# Patient Record
Sex: Female | Born: 1960 | Race: Black or African American | Hispanic: No | Marital: Married | Smoking: Never smoker
Health system: Southern US, Community
[De-identification: ages and names within clinical notes are randomized; demographics above are authoritative.]

## PROBLEM LIST (undated history)

## (undated) DIAGNOSIS — I10 Essential (primary) hypertension: Secondary | ICD-10-CM

## (undated) DIAGNOSIS — E079 Disorder of thyroid, unspecified: Secondary | ICD-10-CM

## (undated) DIAGNOSIS — T7840XA Allergy, unspecified, initial encounter: Secondary | ICD-10-CM

## (undated) HISTORY — PX: PARTIAL HYSTERECTOMY: SHX80

## (undated) HISTORY — DX: Allergy, unspecified, initial encounter: T78.40XA

---

## 1998-01-18 ENCOUNTER — Encounter (HOSPITAL_COMMUNITY): Admission: RE | Admit: 1998-01-18 | Discharge: 1998-04-18 | Payer: Self-pay | Admitting: Internal Medicine

## 1998-02-26 ENCOUNTER — Encounter: Admission: RE | Admit: 1998-02-26 | Discharge: 1998-05-27 | Payer: Self-pay | Admitting: Internal Medicine

## 1998-07-12 ENCOUNTER — Other Ambulatory Visit: Admission: RE | Admit: 1998-07-12 | Discharge: 1998-07-12 | Payer: Self-pay | Admitting: Obstetrics and Gynecology

## 1999-05-28 ENCOUNTER — Encounter: Admission: RE | Admit: 1999-05-28 | Discharge: 1999-08-26 | Payer: Self-pay | Admitting: Internal Medicine

## 1999-07-31 ENCOUNTER — Encounter: Admission: RE | Admit: 1999-07-31 | Discharge: 1999-07-31 | Payer: Self-pay | Admitting: Internal Medicine

## 1999-07-31 ENCOUNTER — Encounter: Payer: Self-pay | Admitting: Internal Medicine

## 1999-08-12 ENCOUNTER — Emergency Department (HOSPITAL_COMMUNITY): Admission: EM | Admit: 1999-08-12 | Discharge: 1999-08-12 | Payer: Self-pay | Admitting: Internal Medicine

## 1999-08-21 ENCOUNTER — Other Ambulatory Visit: Admission: RE | Admit: 1999-08-21 | Discharge: 1999-08-21 | Payer: Self-pay | Admitting: Obstetrics and Gynecology

## 1999-12-27 ENCOUNTER — Encounter: Admission: RE | Admit: 1999-12-27 | Discharge: 2000-03-26 | Payer: Self-pay | Admitting: Internal Medicine

## 2001-08-27 ENCOUNTER — Other Ambulatory Visit: Admission: RE | Admit: 2001-08-27 | Discharge: 2001-08-27 | Payer: Self-pay | Admitting: Obstetrics and Gynecology

## 2002-11-28 ENCOUNTER — Other Ambulatory Visit: Admission: RE | Admit: 2002-11-28 | Discharge: 2002-11-28 | Payer: Self-pay | Admitting: Obstetrics and Gynecology

## 2002-12-16 ENCOUNTER — Encounter: Payer: Self-pay | Admitting: Obstetrics and Gynecology

## 2002-12-16 ENCOUNTER — Encounter: Admission: RE | Admit: 2002-12-16 | Discharge: 2002-12-16 | Payer: Self-pay | Admitting: Obstetrics and Gynecology

## 2004-04-17 ENCOUNTER — Other Ambulatory Visit: Admission: RE | Admit: 2004-04-17 | Discharge: 2004-04-17 | Payer: Self-pay | Admitting: Obstetrics and Gynecology

## 2005-05-09 ENCOUNTER — Other Ambulatory Visit: Admission: RE | Admit: 2005-05-09 | Discharge: 2005-05-09 | Payer: Self-pay | Admitting: Obstetrics and Gynecology

## 2005-10-02 ENCOUNTER — Encounter: Admission: RE | Admit: 2005-10-02 | Discharge: 2005-10-02 | Payer: Self-pay | Admitting: Internal Medicine

## 2008-06-07 ENCOUNTER — Encounter: Admission: RE | Admit: 2008-06-07 | Discharge: 2008-06-07 | Payer: Self-pay | Admitting: Obstetrics and Gynecology

## 2008-06-11 ENCOUNTER — Encounter: Admission: RE | Admit: 2008-06-11 | Discharge: 2008-06-11 | Payer: Self-pay | Admitting: Interventional Radiology

## 2008-07-19 ENCOUNTER — Encounter: Admission: RE | Admit: 2008-07-19 | Discharge: 2008-07-19 | Payer: Self-pay | Admitting: Internal Medicine

## 2008-09-04 ENCOUNTER — Encounter (INDEPENDENT_AMBULATORY_CARE_PROVIDER_SITE_OTHER): Payer: Self-pay | Admitting: Obstetrics and Gynecology

## 2008-09-04 ENCOUNTER — Ambulatory Visit (HOSPITAL_COMMUNITY): Admission: RE | Admit: 2008-09-04 | Discharge: 2008-09-05 | Payer: Self-pay | Admitting: Obstetrics and Gynecology

## 2008-10-20 ENCOUNTER — Encounter: Admission: RE | Admit: 2008-10-20 | Discharge: 2008-10-20 | Payer: Self-pay | Admitting: Obstetrics and Gynecology

## 2008-11-14 ENCOUNTER — Encounter: Admission: RE | Admit: 2008-11-14 | Discharge: 2008-11-14 | Payer: Self-pay | Admitting: Internal Medicine

## 2010-11-03 ENCOUNTER — Encounter: Payer: Self-pay | Admitting: Obstetrics and Gynecology

## 2011-02-25 NOTE — Op Note (Signed)
NAME:  Ashley Sampson, Ashley Sampson NO.:  [id number]   MEDICAL RECORD NO.:  [id number]          PATIENT TYPE:  OIB   LOCATION:  9303                          FACILITY:  [facility]   PHYSICIAN:  Duke Salvia. Marcelle Overlie, M.D.DATE OF BIRTH:  04/25/1961   DATE OF PROCEDURE:  09/04/2008  DATE OF DISCHARGE:                               OPERATIVE REPORT   PREOPERATIVE DIAGNOSIS:  Symptomatic leiomyoma.   POSTOPERATIVE DIAGNOSIS:  Symptomatic leiomyoma.   PROCEDURE:  LAVH.   SURGEON:  Duke Salvia. Marcelle Overlie, MD   ASSISTANT:  Zelphia Cairo, MD   ANESTHESIA:  General endotracheal.   COMPLICATIONS:  None.   DRAINS:  Foley catheter.   BLOOD LOSS:  300 mL.   SPECIMENS REMOVED:  Uterus and cervix.   PROCEDURE AND FINDINGS:  The patient was taken to the operating room.  After an adequate level of general endotracheal anesthesia was obtained  and the patient's legs in stirrups, the abdomen, perineum, and vagina  were prepped and draped in the usual manner for laparoscopy.  The  bladder was drained.  EUA carried out.  Uterus was mobile, 10-week size,  adnexa unremarkable.  Hulka tenaculum was positioned.  Attention  directed to the subumbilical area, which was infiltrated with 0.25%  Marcaine plain.  A small incision was made.  The Veress needle was  introduced without difficulty; its intra-abdominal position verified by  pressure and water testing.  After 2.5 L pneumoperitoneum was then  created, laparoscopic trocar and sleeve were then introduced without  difficulty.  Three fingerbreadths above the symphysis in the midline, a  5-mm trocar was inserted under direct visualization.   Pelvic findings as follows:  Left tube and ovary were unremarkable.  Right tube had some filmy adhesions at the distal pole of the ovary,  which were freed up in an avascular plane.  No other abnormalities noted  except for the uterus being irregular 10-week size, cul-de-sac free and  clear.  Using the instill  instrument starting on the right utero-ovarian  pedicle, this was coagulated and divided down to including the round  ligament on that side with excellent hemostasis.  The exact same  repeated on the opposite side conserving both ovaries.  At this point,  the vaginal portion of the procedure was started.   The patient's legs were extended.  Weighted speculum was positioned.  Cervix grasped with tenaculum.  Cervicovaginal mucosa incised, and  posterior culdotomy performed without difficulty.  The bladder was  advanced superiorly with sharp and blunt dissection until the anterior  peritoneal reflection could be identified.  This was then entered  sharply and retractor used to gently elevate the bladder out of the  field in a sequential manner.  The LigaSure instrument was used to  coagulate and divide the uterosacral ligament, cardinal ligament,  uterine vasculature pedicle, and upper broad ligament pedicles.  Morcellation was required in the central part of the specimen to get the  fundus of the uterus to deliver posteriorly.  Once morcellation had  occurred, the remainder of the fundus was delivered posteriorly and  remaining pedicles were clamped, divided, first free  tied followed by  suture ligature of 0-Vicryl.  The cuff was then closed with 3-9 o'clock  in a running-locked fashion with 2-0 Vicryl suture.  This was  hemostatic.  The vagina was then closed right to left with interrupted 2-  0 Monocryl sutures.  Foley catheter positioned, draining clear urine.  At this point, laparoscopy was carried out with a Nezhat irrigator,  copious irrigation, and aspiration.  Reduced pressure revealed excellent  hemostasis.  Instruments were removed, gas allowed to escape.  Defects  closed with 4-0 Dexon subcuticular sutures and Dermabond.  She tolerated  this well, went to recovery room in good condition.      Richard M. Marcelle Overlie, M.D.  Electronically Signed     RMH/MEDQ  D:  09/04/2008   T:  09/04/2008  Job:  [id number]

## 2011-02-28 NOTE — H&P (Signed)
NAME:  Ashley Sampson, LYNE NO.:  [id number]   MEDICAL RECORD NO.:  [id number]         PATIENT TYPE:  WAMB   LOCATION:                                [facility]ILITY:  [facility]   PHYSICIAN:  Duke Salvia. Marcelle Overlie, M.D.DATE OF BIRTH:  October 29, 1960   DATE OF ADMISSION:  09/04/2008  DATE OF DISCHARGE:                              HISTORY & PHYSICAL   CHIEF COMPLAINT:  Symptomatic leiomyoma, menorrhagia, dysmenorrhea, and  pelvic pain.   HISTORY OF PRESENT ILLNESS:  A 50 year old, G4, P1.  Her husband has had  a vasectomy.  She has a several-year history of worsening problems  related to leiomyoma including menorrhagia and dysmenorrhea.  She was  really trying to avoid the need for hysterectomy, so she underwent in  2007 in our office [facility], but unfortunately continues to have  problems related to pain and bleeding.   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  Due to continued problems.  She presents now for definitive  hysterectomy.  This procedure including risks related to bleeding,  infection, transfusion, adjacent organ injury, and the possible need for  open additional surgery all reviewed with her along with her expected  recovery time.   CURRENT MEDICATIONS:  Motrin and Vicoprofen along with insulin pump and  trapezoid.   Review of systems is significant for diabetes.   Family history is significant for thyroid disease.  She has also had  thyroid disease treated in the past along with her diabetes.  There is a  family history of diabetes also.  Diabetologist is Dr. Felipa Eth.   She has had 1 vaginal delivery and endometrial ablation.  No other  abdominal surgery.   PHYSICAL EXAMINATION:  VITAL SIGNS:  Temp 98.2, blood pressure 120/78.  HEENT:  Unremarkable.  NECK:  Supple without  masses.  LUNGS:  Clear.  CARDIOVASCULAR:  Regular rate and rhythm without murmurs, rubs, or  gallops.  BREASTS:  Without masses.  ABDOMEN:  Soft, flat, nontender.  PELVIC:  Vulva, vagina, cervix normal.  Uterus 10-week size, irregular.  Adnexa negative.  NEUROLOGIC:  Unremarkable.   IMPRESSION:  Symptomatic leiomyoma, failed endometrial ablation with  continued pelvic pain, dysmenorrhea, and abnormal uterine bleeding.   PLAN:  LAVH procedure and risks reviewed as above.      Richard M. Marcelle Overlie, M.D.  Electronically Signed     RMH/MEDQ  D:  08/08/2008  T:  08/09/2008  Job:  [id number]

## 2011-07-15 LAB — COMPREHENSIVE METABOLIC PANEL
ALT: 12
AST: 12
Alkaline Phosphatase: 61
CO2: 30
Calcium: 8.7
Chloride: 102
GFR calc Af Amer: >60
GFR calc non Af Amer: >60
Glucose, Bld: 201 — ABNORMAL HIGH
Potassium: 3.6
Sodium: 138
Total Bilirubin: 0.5

## 2011-07-15 LAB — CBC
Hemoglobin: 11.4 — ABNORMAL LOW
MCV: 84
RBC: 3.27 — ABNORMAL LOW
RBC: 4.02
WBC: 4.6
WBC: 5.9

## 2011-07-15 LAB — TYPE AND SCREEN: Antibody Screen: NEGATIVE

## 2011-10-14 HISTORY — PX: ANKLE SURGERY: SHX546

## 2012-01-07 ENCOUNTER — Other Ambulatory Visit: Payer: Self-pay

## 2012-06-21 ENCOUNTER — Encounter (HOSPITAL_COMMUNITY): Payer: Self-pay

## 2012-06-21 ENCOUNTER — Emergency Department (HOSPITAL_COMMUNITY): Payer: Worker's Compensation

## 2012-06-21 ENCOUNTER — Emergency Department (HOSPITAL_COMMUNITY)
Admission: EM | Admit: 2012-06-21 | Discharge: 2012-06-21 | Disposition: A | Discharge status: Home / Self Care | Payer: Worker's Compensation | Attending: Emergency Medicine | Admitting: Emergency Medicine

## 2012-06-21 DIAGNOSIS — E079 Disorder of thyroid, unspecified: Secondary | ICD-10-CM | POA: Insufficient documentation

## 2012-06-21 DIAGNOSIS — Y9289 Other specified places as the place of occurrence of the external cause: Secondary | ICD-10-CM | POA: Insufficient documentation

## 2012-06-21 DIAGNOSIS — S93409A Sprain of unspecified ligament of unspecified ankle, initial encounter: Secondary | ICD-10-CM | POA: Insufficient documentation

## 2012-06-21 DIAGNOSIS — I10 Essential (primary) hypertension: Secondary | ICD-10-CM | POA: Insufficient documentation

## 2012-06-21 DIAGNOSIS — Y990 Civilian activity done for income or pay: Secondary | ICD-10-CM | POA: Insufficient documentation

## 2012-06-21 DIAGNOSIS — E119 Type 2 diabetes mellitus without complications: Secondary | ICD-10-CM | POA: Insufficient documentation

## 2012-06-21 DIAGNOSIS — W19XXXA Unspecified fall, initial encounter: Secondary | ICD-10-CM | POA: Insufficient documentation

## 2012-06-21 DIAGNOSIS — S93402A Sprain of unspecified ligament of left ankle, initial encounter: Secondary | ICD-10-CM

## 2012-06-21 DIAGNOSIS — Z9641 Presence of insulin pump (external) (internal): Secondary | ICD-10-CM | POA: Insufficient documentation

## 2012-06-21 HISTORY — DX: Essential (primary) hypertension: I10

## 2012-06-21 HISTORY — DX: Disorder of thyroid, unspecified: E07.9

## 2012-06-21 MED ORDER — HYDROCODONE-ACETAMINOPHEN 5-325 MG PO TABS: 2.0000 | ORAL_TABLET | ORAL | Status: AC | PRN

## 2012-06-21 MED ORDER — IBUPROFEN 600 MG PO TABS: 600.0000 mg | ORAL_TABLET | Freq: Four times a day (QID) | ORAL | Status: AC | PRN

## 2012-06-21 MED ORDER — IBUPROFEN 400 MG PO TABS
600.0000 mg | ORAL_TABLET | Freq: Once | ORAL | Status: AC
  Administered 2012-06-21: 600 mg via ORAL
  Filled 2012-06-21: qty 1

## 2012-06-21 NOTE — ED Notes (Signed)
Slipped and fell at work and felt a pop in left ankle. Swelling noted. Unable to bear weight.

## 2012-06-21 NOTE — ED Provider Notes (Signed)
History    This chart was scribed for [name], MD, MD by Smitty Pluck. The patient was seen in room TR07C and the patient's care was started at 8:47AM.   CSN: [id number]  Arrival date & time 06/21/12  0846   First MD Initiated Contact with Patient 06/21/12 [phone]      Chief Complaint  Patient presents with  . Fall    (Consider location/radiation/quality/duration/timing/severity/associated sxs/prior treatment) Patient is a 51 y.o. female presenting with fall. The history is provided by the patient. No language interpreter was used.  Fall Pertinent negatives include no fever, no nausea and no vomiting.   Ashley Sampson is a 51 y.o. female who presents to the Emergency Department complaining of fall at work causing moderate, left ankle pain onset today. Pt reports that she rolled her left ankle. Denies LOC and head injury. Denies any other pain.   Past Medical History  Diagnosis Date  . Hypertension   . Diabetes mellitus   . Thyroid disease     History reviewed. No pertinent past surgical history.  History reviewed. No pertinent family history.  History  Substance Use Topics  . Smoking status: Not on file  . Smokeless tobacco: Not on file  . Alcohol Use:     OB History    Grav Para Term Preterm Abortions TAB SAB Ect Mult Living                  Review of Systems  Constitutional: Negative for fever and chills.  Respiratory: Negative for shortness of breath.   Gastrointestinal: Negative for nausea and vomiting.  Neurological: Negative for weakness.    Allergies  Review of patient's allergies indicates no known allergies.  Home Medications   Current Outpatient Rx  Name Route Sig Dispense Refill  . IBUPROFEN 200 MG PO TABS Oral Take 400 mg by mouth every 6 (six) hours as needed. pain    . METHIMAZOLE 10 MG PO TABS Oral Take 10 mg by mouth daily.    Marland Kitchen OLMESARTAN MEDOXOMIL 5 MG PO TABS Oral Take 5 mg by mouth daily.    Marland Kitchen HYDROCODONE-ACETAMINOPHEN 5-325 MG  PO TABS Oral Take 2 tablets by mouth every 4 (four) hours as needed for pain. 10 tablet 0  . IBUPROFEN 600 MG PO TABS Oral Take 1 tablet (600 mg total) by mouth every 6 (six) hours as needed for pain. 30 tablet 0  . INSULIN PUMP Subcutaneous Inject into the skin continuous. humalog      BP 146/82  Pulse 89  Temp 98.9 F (37.2 C) (Oral)  Resp 18  SpO2 99%  Physical Exam  Nursing note and vitals reviewed. Constitutional: She is oriented to person, place, and time. She appears well-developed and well-nourished. No distress.  HENT:  Head: Normocephalic and atraumatic.  Pulmonary/Chest: Effort normal. No respiratory distress.  Musculoskeletal:       Tenderness at lateral malleolus  Tenderness inferior to heel of left foot  Neurovascular intact No damage to skin No swelling No other a trauma  Neurological: She is alert and oriented to person, place, and time.  Skin: Skin is warm and dry.  Psychiatric: She has a normal mood and affect. Her behavior is normal.    ED Course  Procedures (including critical care time) DIAGNOSTIC STUDIES: Oxygen Saturation is 99% on room air, normal by my interpretation.    COORDINATION OF CARE: 8:51 AM Discussed pt ED treatment with pt  8:51 AM Ordered:   Medications  ibuprofen (  ADVIL,MOTRIN) tablet 600 mg (not administered)  Insulin Human (INSULIN PUMP) 100 unit/ml SOLN (not administered)  olmesartan (BENICAR) 5 MG tablet (not administered)  methimazole (TAPAZOLE) 10 MG tablet (not administered)  ibuprofen (ADVIL,MOTRIN) 200 MG tablet (not administered)  ibuprofen (ADVIL,MOTRIN) 600 MG tablet (not administered)  HYDROcodone-acetaminophen (NORCO/VICODIN) 5-325 MG per tablet (not administered)       Labs Reviewed - No data to display Dg Ankle Complete Left  06/21/2012  *RADIOLOGY REPORT*  Clinical Data: Twisting injury.  Lateral pain.  LEFT ANKLE COMPLETE - 3+ VIEW  Comparison: None.  Findings: There is mild lateral soft tissue swelling.  No  visible fracture.  IMPRESSION: Lateral swelling.  No bony abnormality.   Original Report Authenticated By: Thomasenia Sales, M.D.      1. Left ankle sprain       MDM  I personally performed the services described in this documentation, which was scribed in my presence. The recorded information has been reviewed and considered.     [name], MD 06/25/12 1924

## 2012-06-21 NOTE — Progress Notes (Signed)
Orthopedic Tech Progress Note Patient Details:  Ashley Sampson Jan 01, 1961 [id number]  Ortho Devices Type of Ortho Device: Crutches;Ace wrap Ortho Device/Splint Location: ace wrap left ankle Ortho Device/Splint Interventions: Application   Cammer, Mickie Bail 06/21/2012, 9:28 AM

## 2013-01-26 ENCOUNTER — Other Ambulatory Visit: Payer: Self-pay | Admitting: Sports Medicine

## 2013-01-26 DIAGNOSIS — M25511 Pain in right shoulder: Secondary | ICD-10-CM

## 2013-02-01 ENCOUNTER — Ambulatory Visit
Admission: RE | Admit: 2013-02-01 | Discharge: 2013-02-01 | Disposition: A | Discharge status: Home / Self Care | Payer: BC Managed Care – PPO | Source: Ambulatory Visit | Attending: Sports Medicine | Admitting: Sports Medicine

## 2013-02-01 DIAGNOSIS — M25511 Pain in right shoulder: Secondary | ICD-10-CM

## 2013-02-04 ENCOUNTER — Other Ambulatory Visit: Payer: Self-pay

## 2013-09-07 ENCOUNTER — Encounter: Payer: Self-pay | Admitting: Internal Medicine

## 2013-11-02 ENCOUNTER — Telehealth: Payer: Self-pay

## 2013-11-02 ENCOUNTER — Ambulatory Visit (AMBULATORY_SURGERY_CENTER): Payer: Self-pay

## 2013-11-02 VITALS — Ht 64.0 in | Wt 158.0 lb

## 2013-11-02 DIAGNOSIS — Z1211 Encounter for screening for malignant neoplasm of colon: Secondary | ICD-10-CM

## 2013-11-02 MED ORDER — MOVIPREP 100 G PO SOLR: 1.0000 | Freq: Once | ORAL | Status: DC

## 2013-11-02 NOTE — Telephone Encounter (Signed)
Magda Paganini,   Please contact MD that manages insulin pump for instructions during colon prep.  Thank you, Levada Dy

## 2013-11-03 ENCOUNTER — Encounter: Payer: Self-pay | Admitting: Internal Medicine

## 2013-11-03 NOTE — Telephone Encounter (Signed)
Sent insulin pump letter to Dr. Dagmar Hait

## 2013-11-08 ENCOUNTER — Telehealth: Payer: Self-pay

## 2013-11-08 NOTE — Telephone Encounter (Signed)
Called pt and relayed information given to me by Dr. Danna Hefty office about her insulin pump which was as follows:  Reduce insulin to basal dose minus 30%; monitor blood sugars the night before to make she she does not have any hypoglycemic episodes.  Diet per GI (clear liquids); if the clear liquids have significant carbs she may need to blolus with short acting insulin.  Patient understood and agreed to follow these instructions.

## 2013-11-16 ENCOUNTER — Ambulatory Visit (AMBULATORY_SURGERY_CENTER): Payer: BC Managed Care – PPO | Admitting: Internal Medicine

## 2013-11-16 ENCOUNTER — Encounter: Payer: Self-pay | Admitting: Internal Medicine

## 2013-11-16 VITALS — BP 136/68 | HR 88 | Temp 98.3°F | Resp 18 | Ht 64.0 in | Wt 158.0 lb

## 2013-11-16 DIAGNOSIS — D126 Benign neoplasm of colon, unspecified: Secondary | ICD-10-CM

## 2013-11-16 DIAGNOSIS — Z1211 Encounter for screening for malignant neoplasm of colon: Secondary | ICD-10-CM

## 2013-11-16 MED ORDER — SODIUM CHLORIDE 0.9 % IV SOLN: 500.0000 mL | INTRAVENOUS | Status: DC

## 2013-11-16 NOTE — Patient Instructions (Signed)
Colon polyp x 1 removed today, see handout on polyps. Resume current medications. Blood sugar 160 in recovery.  Call us with any questions or concerns. Thank you!!  YOU HAD AN ENDOSCOPIC PROCEDURE TODAY AT [facility]: Refer to the procedure report that was given to you for any specific questions about what was found during the examination.  If the procedure report does not answer your questions, please call your gastroenterologist to clarify.  If you requested that your care partner not be given the details of your procedure findings, then the procedure report has been included in a sealed envelope for you to review at your convenience later.  YOU SHOULD EXPECT: Some feelings of bloating in the abdomen. Passage of more gas than usual.  Walking can help get rid of the air that was put into your GI tract during the procedure and reduce the bloating. If you had a lower endoscopy (such as a colonoscopy or flexible sigmoidoscopy) you may notice spotting of blood in your stool or on the toilet paper. If you underwent a bowel prep for your procedure, then you may not have a normal bowel movement for a few days.  DIET: Your first meal following the procedure should be a light meal and then it is ok to progress to your normal diet.  A half-sandwich or bowl of soup is an example of a good first meal.  Heavy or fried foods are harder to digest and may make you feel nauseous or bloated.  Likewise meals heavy in dairy and vegetables can cause extra gas to form and this can also increase the bloating.  Drink plenty of fluids but you should avoid alcoholic beverages for 24 hours.  ACTIVITY: Your care partner should take you home directly after the procedure.  You should plan to take it easy, moving slowly for the rest of the day.  You can resume normal activity the day after the procedure however you should NOT DRIVE or use heavy machinery for 24 hours (because of the sedation medicines used during the  test).    SYMPTOMS TO REPORT IMMEDIATELY: A gastroenterologist can be reached at any hour.  During normal business hours, 8:30 AM to 5:00 PM Monday through Friday, call [phone].  After hours and on weekends, please call the GI answering service at [phone] who will take a message and have the physician on call contact you.   Following lower endoscopy (colonoscopy or flexible sigmoidoscopy):  Excessive amounts of blood in the stool  Significant tenderness or worsening of abdominal pains  Swelling of the abdomen that is new, acute  Fever of 100F or higher  Following upper endoscopy (EGD)  Vomiting of blood or coffee ground material  New chest pain or pain under the shoulder blades  Painful or persistently difficult swallowing  New shortness of breath  Fever of 100F or higher  Black, tarry-looking stools  FOLLOW UP: If any biopsies were taken you will be contacted by phone or by letter within the next 1-3 weeks.  Call your gastroenterologist if you have not heard about the biopsies in 3 weeks.  Our staff will call the home number listed on your records the next business day following your procedure to check on you and address any questions or concerns that you may have at that time regarding the information given to you following your procedure. This is a courtesy call and so if there is no answer at the home number and we have not  heard from you through the emergency physician on call, we will assume that you have returned to your regular daily activities without incident.  SIGNATURES/CONFIDENTIALITY: You and/or your care partner have signed paperwork which will be entered into your electronic medical record.  These signatures attest to the fact that that the information above on your After Visit Summary has been reviewed and is understood.  Full responsibility of the confidentiality of this discharge information lies with you and/or your care-partner.

## 2013-11-16 NOTE — Progress Notes (Signed)
Called to room to assist during endoscopic procedure.  Patient ID and intended procedure confirmed with present staff. Received instructions for my participation in the procedure from the performing physician.  

## 2013-11-16 NOTE — Progress Notes (Signed)
Report to pacu rn, vss, bbs=clear 

## 2013-11-16 NOTE — Op Note (Signed)
[place]  [address]. [place], [id number]   COLONOSCOPY PROCEDURE REPORT  PATIENT: Ashley Sampson, Ashley Sampson  MR#: [id number] BIRTHDATE: 20-Aug-1961 , 52  yrs. old GENDER: Female ENDOSCOPIST: Eustace Quail, MD REFERRED YK:[id number] Avva, M.D. PROCEDURE DATE:  11/16/2013 PROCEDURE:   Colonoscopy with snare polypectomy x 1 First Screening Colonoscopy - Avg.  risk and is 50 yrs.  old or older Yes.  Prior Negative Screening - Now for repeat screening. N/A  History of Adenoma - Now for follow-up colonoscopy & has been > or = to 3 yrs.  N/A  Polyps Removed Today? Yes. ASA CLASS:   Class II INDICATIONS:average risk screening. MEDICATIONS: MAC sedation, administered by CRNA and propofol (Diprivan) 350mg  IV  DESCRIPTION OF PROCEDURE:   After the risks benefits and alternatives of the procedure were thoroughly explained, informed consent was obtained.  A digital rectal exam revealed no abnormalities of the rectum.   The LB [id number]  endoscope was introduced through the anus and advanced to the cecum, which was identified by both the appendix and ileocecal valve. No adverse events experienced.   The quality of the prep was excellent, using MoviPrep  The instrument was then slowly withdrawn as the colon was fully examined.    COLON FINDINGS: A diminutive polyp was found in the ascending colon. A polypectomy was performed with a cold snare.  The resection was complete and the polyp tissue was completely retrieved.   The colon mucosa was otherwise normal.  Retroflexed views revealed internal hemorrhoids. The time to cecum=3 minutes 34 seconds.  Withdrawal time=9 minutes 32 seconds.  The scope was withdrawn and the procedure completed. COMPLICATIONS: There were no complications.  ENDOSCOPIC IMPRESSION: 1.   Diminutive polyp was found in the ascending colon; polypectomy was performed with a cold snare 2.   The colon mucosa was otherwise  normal  RECOMMENDATIONS: 1. Repeat colonoscopy in 5 years if polyp adenomatous; otherwise 10 years   eSigned:  Eustace Quail, MD 11/16/2013 8:49 AM   cc: Prince Solian, MD and The Patient

## 2013-11-17 ENCOUNTER — Telehealth: Payer: Self-pay

## 2013-11-17 NOTE — Telephone Encounter (Signed)
Left message on answering machine. 

## 2013-11-21 ENCOUNTER — Encounter: Payer: Self-pay | Admitting: Internal Medicine

## 2015-01-11 ENCOUNTER — Other Ambulatory Visit: Payer: Self-pay | Admitting: Obstetrics and Gynecology

## 2015-01-12 LAB — CYTOLOGY - PAP

## 2017-09-09 ENCOUNTER — Other Ambulatory Visit: Payer: Self-pay | Admitting: Obstetrics and Gynecology

## 2017-09-09 DIAGNOSIS — R928 Other abnormal and inconclusive findings on diagnostic imaging of breast: Secondary | ICD-10-CM

## 2017-09-15 ENCOUNTER — Ambulatory Visit
Admission: RE | Admit: 2017-09-15 | Discharge: 2017-09-15 | Disposition: A | Discharge status: Home / Self Care | Payer: BC Managed Care – PPO | Source: Ambulatory Visit | Attending: Obstetrics and Gynecology | Admitting: Obstetrics and Gynecology

## 2017-09-15 DIAGNOSIS — R928 Other abnormal and inconclusive findings on diagnostic imaging of breast: Secondary | ICD-10-CM

## 2018-10-07 IMAGING — MG 2D DIGITAL DIAGNOSTIC UNILATERAL LEFT MAMMOGRAM WITH CAD AND ADJ
6 of 9 series · 6 of 21 positions shown · non-contrast
Comparison: Mammography 09/02/2017, 04/17/2016 and earlier.

CLINICAL DATA: Recall from screening mammography, possible mass in
the upper outer quadrant of the left breast.

EXAM:
2D DIGITAL DIAGNOSTIC LEFT MAMMOGRAM WITH ADJUNCT TOMO
ULTRASOUND LEFT BREAST

[L CC (1 of 2)]
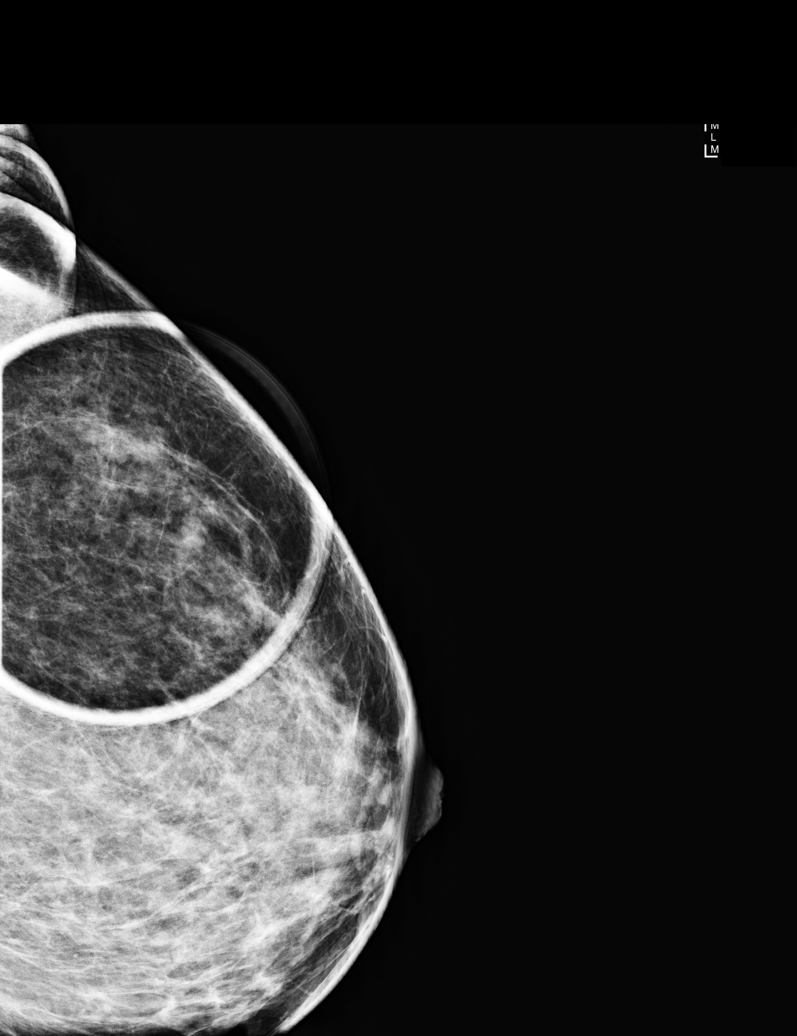

[L CC synth-2D (1 of 2)]
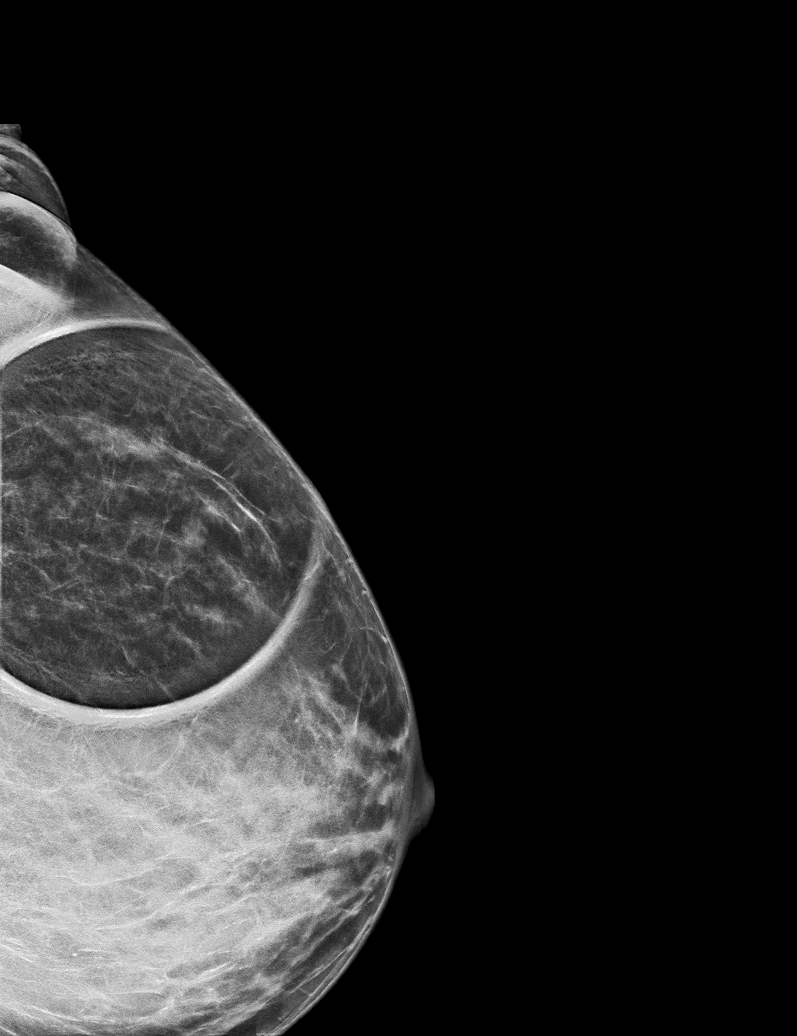

[L MLO synth-2D]
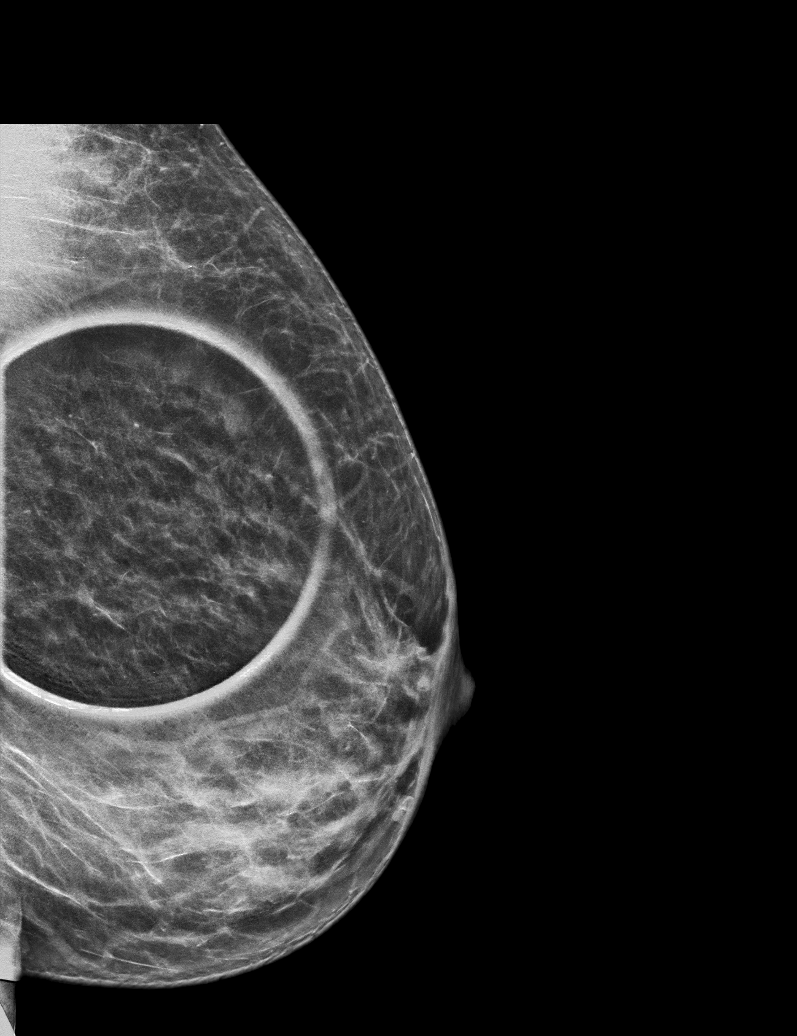

[L CC synth-2D (2 of 2)]
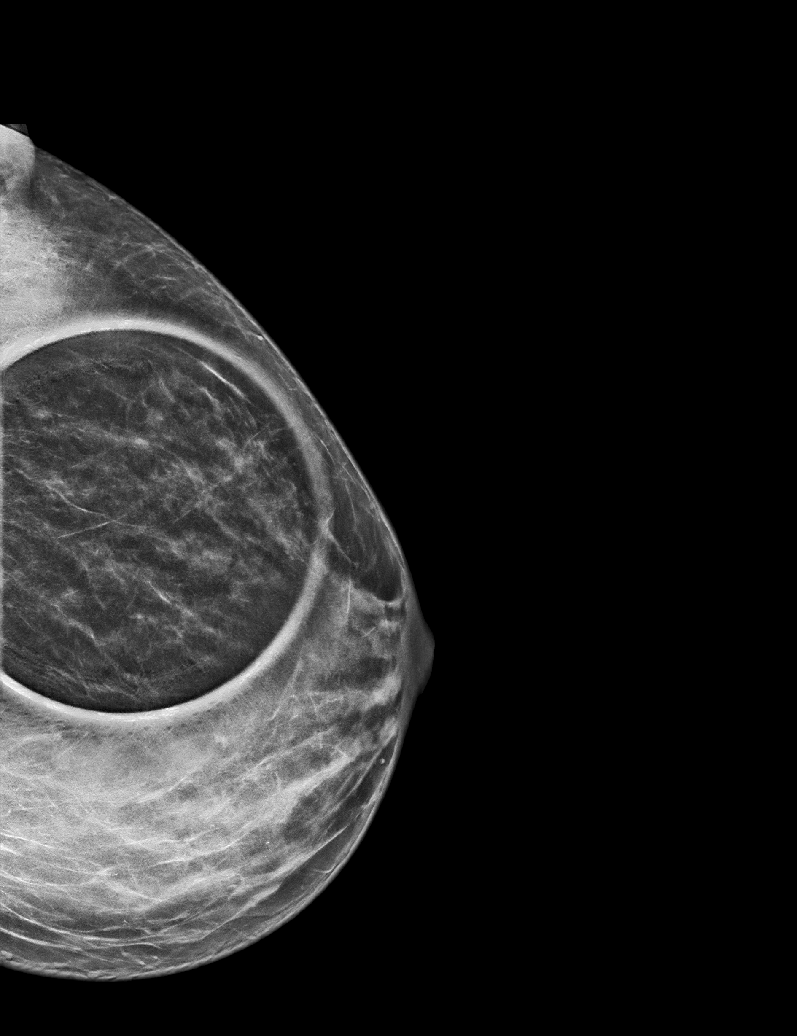

[L MLO]
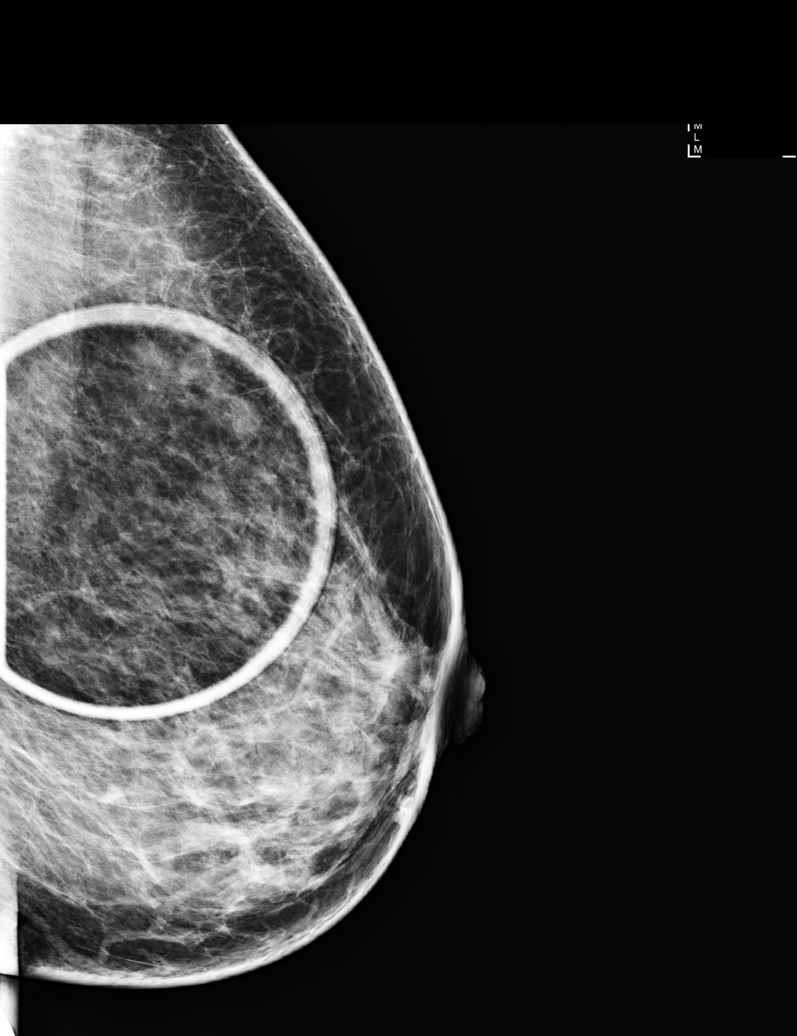

[L CC (2 of 2)]
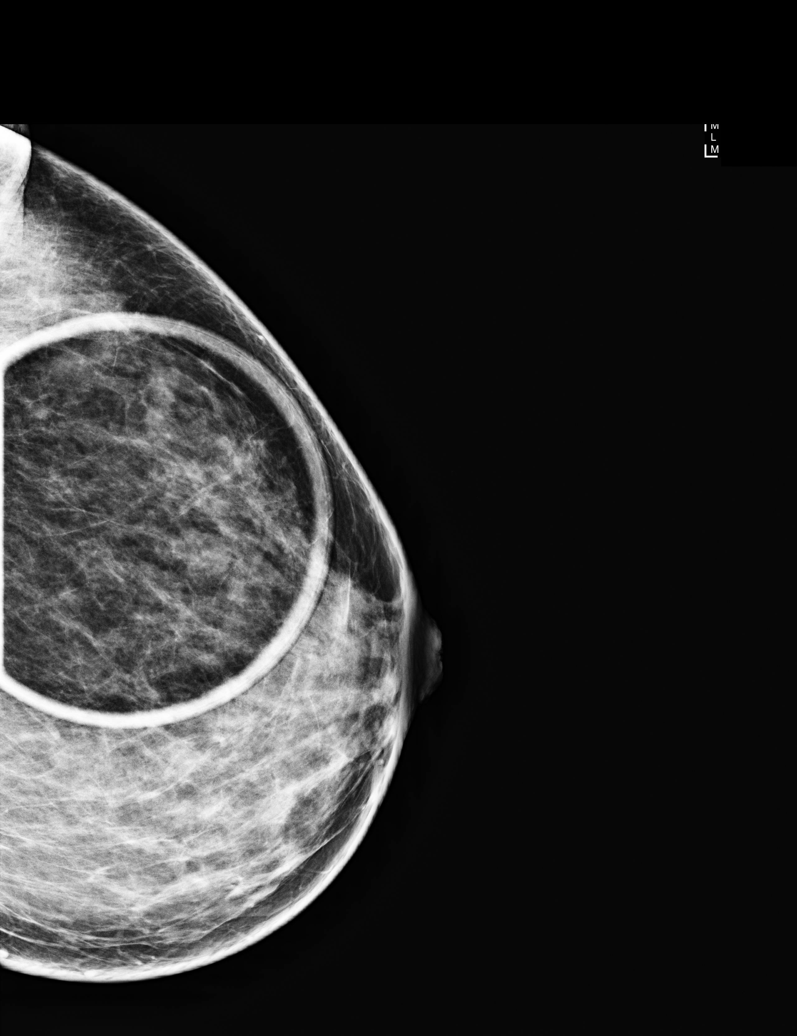

[6 of 21 positions shown; findings below may reference images not displayed]

No
prior left breast ultrasound.

ACR Breast Density Category c: The breast tissue is heterogeneously
dense, which may obscure small masses.
FINDINGS: Standard and tomosynthesis spot-compression CC and MLO views of the
area concern in the left breast were obtained.

These confirm a circumscribed low-density mass measuring
approximately 8-9 mm. There is no associated architectural
distortion or suspicious calcifications.

On physical exam, there is no palpable abnormality in the left
breast.

Targeted left breast ultrasound is performed, showing an oval
circumscribed parallel nearly anechoic mass deep at the 2 o'clock
position approximately 3 cm from the nipple measuring approximately
ducts 4 x 5 x 7 mm, demonstrating no internal power Doppler flow and
demonstrating posterior acoustic enhancement, corresponding to the
screening mammographic finding. A similar oval circumscribed
parallel nearly anechoic mass is identified at the 2 o'clock
position approximately 4 cm from the nipple measuring approximately
4 x 4 x 7 mm. No suspicious solid mass or abnormal acoustic
shadowing is identified.
IMPRESSION: 1. Benign cysts in the upper outer quadrant of the left breast, one
of which corresponds to the screening mammographic finding.
2. No mammographic or sonographic evidence of malignancy involving
the left breast.

RECOMMENDATION:
Screening mammogram in one year.(Code:T3-D-8G4)

I have discussed the findings and recommendations with the patient.
Results were also provided in writing at the conclusion of the
visit. If applicable, a reminder letter will be sent to the patient
regarding the next appointment.

BI-RADS CATEGORY  2: Benign.

## 2018-12-11 ENCOUNTER — Encounter: Payer: Self-pay | Admitting: Internal Medicine

## 2019-08-17 ENCOUNTER — Encounter: Payer: Self-pay | Admitting: Internal Medicine

## 2019-09-12 ENCOUNTER — Telehealth: Payer: Self-pay | Admitting: *Deleted

## 2019-09-12 NOTE — Telephone Encounter (Signed)
PCP: Dr.Avva

## 2019-09-12 NOTE — Telephone Encounter (Signed)
Patient is for a recall colonoscopy with Dr.Perry on 10/03/2019, PV is on 09/19/2019. Patient has an insulin pump, please contact PCP for insulin pump instructions. Thank you, Ashley Sampson pv

## 2019-09-13 NOTE — Telephone Encounter (Signed)
Faxed insulin pump letter to Dr. Danna Hefty office.  Will let you know when I hear back.

## 2019-09-16 ENCOUNTER — Telehealth: Payer: Self-pay

## 2019-09-16 NOTE — Telephone Encounter (Signed)
Lm for patient to return my call so I could give her insulin pump instructions from Dr. Dagmar Hait prior to her procedure.

## 2019-09-19 ENCOUNTER — Ambulatory Visit (AMBULATORY_SURGERY_CENTER): Payer: Self-pay

## 2019-09-19 ENCOUNTER — Other Ambulatory Visit: Payer: Self-pay

## 2019-09-19 VITALS — Temp 97.1°F | Ht 64.0 in | Wt 172.8 lb

## 2019-09-19 DIAGNOSIS — Z86010 Personal history of colonic polyps: Secondary | ICD-10-CM

## 2019-09-19 MED ORDER — NA SULFATE-K SULFATE-MG SULF 17.5-3.13-1.6 GM/177ML PO SOLN: 1.0000 | Freq: Once | ORAL | 0 refills | Status: AC

## 2019-09-19 NOTE — Progress Notes (Signed)
Denies allergies to eggs or soy products. Denies complication of anesthesia or sedation. Denies use of weight loss medication. Denies use of O2.   Emmi instructions given for colonoscopy. Covid screening is scheduled for 09/28/19 @ 3:20 Pm. A 15.00 coupon for Suprep was given to the patient. Patient was told to call Magda Paganini to receive her Insulin Pump instructions.

## 2019-09-20 ENCOUNTER — Encounter: Payer: Self-pay | Admitting: Internal Medicine

## 2019-09-21 ENCOUNTER — Telehealth: Payer: Self-pay | Admitting: Internal Medicine

## 2019-09-21 NOTE — Telephone Encounter (Signed)
Insulin pump instructions relayed to patient.  See other phone note.

## 2019-09-21 NOTE — Telephone Encounter (Signed)
Spoke with patient and relayed insulin pump instructions from Dr. Dagmar Hait:  Wear both pump and sensor; keep pump in auto mode; when she starts the clear liquid diet, start temp target x 24 hours; restart temp target morning of procedure and run x 6 hours.  Patient understood

## 2019-09-28 ENCOUNTER — Other Ambulatory Visit: Payer: Self-pay | Admitting: Internal Medicine

## 2019-09-28 ENCOUNTER — Ambulatory Visit (INDEPENDENT_AMBULATORY_CARE_PROVIDER_SITE_OTHER): Payer: BC Managed Care – PPO

## 2019-09-28 DIAGNOSIS — Z1159 Encounter for screening for other viral diseases: Secondary | ICD-10-CM

## 2019-09-29 LAB — SARS CORONAVIRUS 2 (TAT 6-24 HRS): SARS Coronavirus 2: NEGATIVE

## 2019-09-30 NOTE — Telephone Encounter (Signed)
Per Charlett Lango patient was given insulin pump instructions.

## 2019-10-03 ENCOUNTER — Other Ambulatory Visit: Payer: Self-pay

## 2019-10-03 ENCOUNTER — Encounter: Payer: Self-pay | Admitting: Internal Medicine

## 2019-10-03 ENCOUNTER — Ambulatory Visit (AMBULATORY_SURGERY_CENTER): Payer: BC Managed Care – PPO | Admitting: Internal Medicine

## 2019-10-03 VITALS — BP 126/63 | HR 70 | Temp 98.9°F | Resp 16 | Ht 64.0 in | Wt 172.8 lb

## 2019-10-03 DIAGNOSIS — Z86010 Personal history of colonic polyps: Secondary | ICD-10-CM

## 2019-10-03 MED ORDER — SODIUM CHLORIDE 0.9 % IV SOLN: 500.0000 mL | Freq: Once | INTRAVENOUS | Status: DC

## 2019-10-03 NOTE — Patient Instructions (Signed)
Next colonoscopy due in 10 years.    YOU HAD AN ENDOSCOPIC PROCEDURE TODAY AT [facility]:   Refer to the procedure report that was given to you for any specific questions about what was found during the examination.  If the procedure report does not answer your questions, please call your gastroenterologist to clarify.  If you requested that your care partner not be given the details of your procedure findings, then the procedure report has been included in a sealed envelope for you to review at your convenience later.  YOU SHOULD EXPECT: Some feelings of bloating in the abdomen. Passage of more gas than usual.  Walking can help get rid of the air that was put into your GI tract during the procedure and reduce the bloating. If you had a lower endoscopy (such as a colonoscopy or flexible sigmoidoscopy) you may notice spotting of blood in your stool or on the toilet paper. If you underwent a bowel prep for your procedure, you may not have a normal bowel movement for a few days.  Please Note:  You might notice some irritation and congestion in your nose or some drainage.  This is from the oxygen used during your procedure.  There is no need for concern and it should clear up in a day or so.  SYMPTOMS TO REPORT IMMEDIATELY:   Following lower endoscopy (colonoscopy or flexible sigmoidoscopy):  Excessive amounts of blood in the stool  Significant tenderness or worsening of abdominal pains  Swelling of the abdomen that is new, acute  Fever of 100F or higher   For urgent or emergent issues, a gastroenterologist can be reached at any hour by calling [phone].   DIET:  We do recommend a small meal at first, but then you may proceed to your regular diet.  Drink plenty of fluids but you should avoid alcoholic beverages for 24 hours.  ACTIVITY:  You should plan to take it easy for the rest of today and you should NOT DRIVE or use heavy machinery until tomorrow (because of the  sedation medicines used during the test).    FOLLOW UP: Our staff will call the number listed on your records 48-72 hours following your procedure to check on you and address any questions or concerns that you may have regarding the information given to you following your procedure. If we do not reach you, we will leave a message.  We will attempt to reach you two times.  During this call, we will ask if you have developed any symptoms of COVID 19. If you develop any symptoms (ie: fever, flu-like symptoms, shortness of breath, cough etc.) before then, please call [phone].  If you test positive for Covid 19 in the 2 weeks post procedure, please call and report this information to [place].    If any biopsies were taken you will be contacted by phone or by letter within the next 1-3 weeks.  Please call us at [phone] if you have not heard about the biopsies in 3 weeks.    SIGNATURES/CONFIDENTIALITY: You and/or your care partner have signed paperwork which will be entered into your electronic medical record.  These signatures attest to the fact that that the information above on your After Visit Summary has been reviewed and is understood.  Full responsibility of the confidentiality of this discharge information lies with you and/or your care-partner.

## 2019-10-03 NOTE — Progress Notes (Signed)
Temp by JB, VS by DT  Pt's states no medical or surgical changes since previsit or office visit. 

## 2019-10-03 NOTE — Progress Notes (Signed)
PT taken to PACU. Monitors in place. VSS. Report given to RN. 

## 2019-10-03 NOTE — Op Note (Signed)
[place] Patient Name: Ashley Sampson Procedure Date: 10/03/2019 8:42 AM MRN: [id number] Endoscopist: Docia Chuck. Henrene Pastor , MD Age: 58 Referring MD:  Date of Birth: 05-06-1961 Gender: Female Account #: [id number] Procedure:                Colonoscopy Indications:              High risk colon cancer surveillance: Personal                            history of non-advanced adenoma. Prior examination                            2015 Medicines:                Monitored Anesthesia Care Procedure:                Pre-Anesthesia Assessment:                           - Prior to the procedure, a History and Physical                            was performed, and patient medications and                            allergies were reviewed. The patient's tolerance of                            previous anesthesia was also reviewed. The risks                            and benefits of the procedure and the sedation                            options and risks were discussed with the patient.                            All questions were answered, and informed consent                            was obtained. Prior Anticoagulants: The patient has                            taken no previous anticoagulant or antiplatelet                            agents. ASA Grade Assessment: II - A patient with                            mild systemic disease. After reviewing the risks                            and benefits, the patient was deemed in  satisfactory condition to undergo the procedure.                           After obtaining informed consent, the colonoscope                            was passed under direct vision. Throughout the                            procedure, the patient's blood pressure, pulse, and                            oxygen saturations were monitored continuously. The                            Colonoscope was introduced through the anus and                     advanced to the the cecum, identified by                            appendiceal orifice and ileocecal valve. The                            ileocecal valve, appendiceal orifice, and rectum                            were photographed. The quality of the bowel                            preparation was excellent. The colonoscopy was                            performed without difficulty. The patient tolerated                            the procedure well. The bowel preparation used was                            SUPREP via split dose instruction. Scope In: 8:57:36 AM Scope Out: 9:10:15 AM Scope Withdrawal Time: 0 hours 10 minutes 26 seconds  Total Procedure Duration: 0 hours 12 minutes 39 seconds  Findings:                 The entire examined colon appeared normal on direct                            and retroflexion views. Complications:            No immediate complications. Estimated blood loss:                            None. Estimated Blood Loss:     Estimated blood loss: none. Impression:               - The entire examined colon is normal on direct and  retroflexion views.                           - No specimens collected. Recommendation:           - Repeat colonoscopy in 10 years for surveillance.                           - Patient has a contact number available for                            emergencies. The signs and symptoms of potential                            delayed complications were discussed with the                            patient. Return to normal activities tomorrow.                            Written discharge instructions were provided to the                            patient.                           - Resume previous diet.                           - Continue present medications. Docia Chuck. Henrene Pastor, MD 10/03/2019 9:15:16 AM This report has been signed electronically.

## 2019-10-05 ENCOUNTER — Telehealth: Payer: Self-pay

## 2019-10-05 NOTE — Telephone Encounter (Signed)
  Follow up Call-  Call back number 10/03/2019  Post procedure Call Back phone  # [phone]  Permission to leave phone message Yes  Some recent data might be hidden     Patient questions:  Do you have a fever, pain , or abdominal swelling? No. Pain Score  0 *  Have you tolerated food without any problems? Yes.    Have you been able to return to your normal activities? Yes.    Do you have any questions about your discharge instructions: Diet   No. Medications  No. Follow up visit  No.  Do you have questions or concerns about your Care? No.  Actions: * If pain score is 4 or above: No action needed, pain <4.  1. Have you developed a fever since your procedure? no  2.   Have you had an respiratory symptoms (SOB or cough) since your procedure? no  3.   Have you tested positive for COVID 19 since your procedure no  4.   Have you had any family members/close contacts diagnosed with the COVID 19 since your procedure?  no   If yes to any of these questions please route to Joylene John, RN and Alphonsa Gin, Therapist, sports.

## 2024-08-30 ENCOUNTER — Ambulatory Visit: Payer: Self-pay | Admitting: Internal Medicine

## 2024-09-20 ENCOUNTER — Other Ambulatory Visit: Payer: Self-pay

## 2024-09-20 ENCOUNTER — Encounter: Payer: Self-pay | Admitting: Internal Medicine

## 2024-09-20 ENCOUNTER — Ambulatory Visit: Admitting: Internal Medicine

## 2024-09-20 VITALS — BP 124/64 | HR 80 | Temp 98.4°F | Ht 64.0 in | Wt 183.4 lb

## 2024-09-20 DIAGNOSIS — J3089 Other allergic rhinitis: Secondary | ICD-10-CM | POA: Diagnosis not present

## 2024-09-20 DIAGNOSIS — L500 Allergic urticaria: Secondary | ICD-10-CM | POA: Diagnosis not present

## 2024-09-20 MED ORDER — CETIRIZINE HCL 10 MG PO TABS: 10.0000 mg | ORAL_TABLET | Freq: Every day | ORAL | 5 refills | Status: AC | PRN

## 2024-09-20 NOTE — Progress Notes (Signed)
 NEW PATIENT  Date of Service/Encounter:  09/20/24  Consult requested by: Avva, Ravisankar, MD   Subjective:   Ashley Sampson (DOB: [birth year]) is a 63 y.o. female who presents to the clinic on 09/20/2024 with a chief complaint of Food Allergy (Shellfish) and Establish Care .    History obtained from: chart review and patient.   Food Reactions:  Initial reaction was about 3 years ago and then another reaction about 3 months ago. 3 years ago, she had sensation of throat closing after eating shellfish.  But since then, she kept eating shellfish and did not have any issues until recently.  About 3 months ago, noted having hives and facial swelling with itching after about 3 hours after eating shellfish for dinner. Took benadryl and it resolved.  Has eaten shellfish since then without a reaction but she is worried.  No illness, no new medications, no new products.   No history of allergic rhinitis.  No history of prior episodes of hives/swelling.    Rhinitis:  Started many years ago.  Symptoms include: nasal congestion, rhinorrhea, and post nasal drainage  Occurs seasonally-Fall/Winter Potential triggers: cats   Treatments tried:  Benadryl PRN   Previous allergy testing: no  History of sinus surgery: no Nonallergic triggers: none   Reviewed:  07/29/2024: Seen by [facility] for reactions to shellfish, planning to refer to Allergy.  Also notes family hx of shellfish allergy.   Past Medical History: Past Medical History:  Diagnosis Date   Allergy    Diabetes mellitus    Hypertension    Thyroid disease    Past Surgical History: Past Surgical History:  Procedure Laterality Date   ANKLE SURGERY  2013   scope for scar tissues after sprain   PARTIAL HYSTERECTOMY     abdominal 2009    Family History: Family History  Problem Relation Age of Onset   Colon cancer Neg Hx    Pancreatic cancer Neg Hx    Stomach cancer Neg Hx    Esophageal cancer Neg Hx     Rectal cancer Neg Hx     Social History:  Flooring in bedroom: wood Pets: none Tobacco use/exposure: none Job: educator   Medication List:  Allergies as of 09/20/2024   No Known Allergies      Medication List        Accurate as of September 20, 2024  2:13 PM. If you have any questions, ask your nurse or doctor.          Accu-Chek Guide Test test strip Generic drug: glucose blood 4 (four) times daily.   albuterol 108 (90 Base) MCG/ACT inhaler Commonly known as: VENTOLIN HFA Inhale 2 puffs into the lungs as needed.   CALTRATE 600 PO Take by mouth.   chlorthalidone 50 MG tablet Commonly known as: HYGROTON Take 50 mg by mouth daily.   Fluocinolone Acetonide Scalp 0.01 % Oil   FreeStyle Libre 3 Sensor Misc every 30 (thirty) days.   ibuprofen  200 MG tablet Commonly known as: ADVIL  Take 800 mg by mouth every 6 (six) hours as needed. pain   insulin aspart 100 UNIT/ML injection Commonly known as: novoLOG Inject 10 Units into the skin 3 (three) times daily before meals. Per sliding scale as needed.   insulin pump Soln Inject into the skin continuous. humalog   IRBESARTAN PO Take 300 mg by mouth.   meloxicam 7.5 MG tablet Commonly known as: MOBIC Take 7.5 mg by mouth daily.   methimazole 10  MG tablet Commonly known as: TAPAZOLE Take 10 mg by mouth daily.   potassium chloride SA 20 MEQ tablet Commonly known as: KLOR-CON M Take 20 mEq by mouth daily. with food   Tresiba 100 UNIT/ML Soln Generic drug: Insulin Degludec SMARTSIG:20 Unit(s) SUB-Q Daily   Vitamin D-3 125 MCG (5000 UT) Tabs Take by mouth.   zolpidem 10 MG tablet Commonly known as: AMBIEN Take 10 mg by mouth.         REVIEW OF SYSTEMS: Pertinent positives and negatives discussed in HPI.   Objective:   Physical Exam: BP 124/64 (Cuff Size: Normal)   Pulse 80   Temp 98.4 F (36.9 C)   Ht 5' 4 (1.626 m)   Wt 183 lb 6.4 oz (83.2 kg)   SpO2 98%   BMI 31.48 kg/m  Body mass  index is 31.48 kg/m. GEN: alert, well developed HEENT: clear conjunctiva, nose with + mild inferior turbinate hypertrophy, pink nasal mucosa, slight clear rhinorrhea,no cobblestoning HEART: regular rate and rhythm, no murmur LUNGS: clear to auscultation bilaterally, no coughing, unlabored respiration ABDOMEN: soft, non distended  SKIN: no rashes or lesions   Assessment:   1. Allergic urticaria   2. Other allergic rhinitis     Plan/Recommendations:  Other Allergic Rhinitis: - Due to turbinate hypertrophy, seasonal symptoms and unresponsive to over the counter meds, will perform skin testing to identify aeroallergen triggers.   - Use nasal saline rinses before nose sprays such as with Neilmed Sinus Rinse.  Use distilled water.   - Use Zyrtec  10 mg daily as needed for runny nose, sneezing, itchy watery eyes.   Food Reactions:  - please strictly avoid shellfish for now.  - Initial rxn: 3 years ago with sensation of throat closing and 3 months ago with hives/swelling/itching but delayed reaction   Hold all anti-histamines (Xyzal, Allegra, Zyrtec , Claritin, Benadryl, Pepcid) 3 days prior to next visit.  Follow up: 12/16 at 315 for skin testing 1-55, shellfish mix+ individuals    Arleta Blanch, MD [facility] 

## 2024-09-20 NOTE — Patient Instructions (Addendum)
 Other Allergic Rhinitis: - Use nasal saline rinses before nose sprays such as with Neilmed Sinus Rinse.  Use distilled water.   - Use Zyrtec  10 mg daily as needed for runny nose, sneezing, itchy watery eyes.   Food Reactions:  - please strictly avoid shellfish for now.     Hold all anti-histamines (Xyzal, Allegra, Zyrtec , Claritin, Benadryl, Pepcid) 3 days prior to next visit.  Follow up: 12/16 at 315 for skin testing 1-55, shellfish mix+ individuals

## 2024-09-27 ENCOUNTER — Ambulatory Visit: Admitting: Internal Medicine

## 2024-09-27 DIAGNOSIS — L500 Allergic urticaria: Secondary | ICD-10-CM

## 2024-09-27 DIAGNOSIS — J3081 Allergic rhinitis due to animal (cat) (dog) hair and dander: Secondary | ICD-10-CM

## 2024-09-27 DIAGNOSIS — J301 Allergic rhinitis due to pollen: Secondary | ICD-10-CM | POA: Diagnosis not present

## 2024-09-27 DIAGNOSIS — J3089 Other allergic rhinitis: Secondary | ICD-10-CM

## 2024-09-27 MED ORDER — FLUTICASONE PROPIONATE 50 MCG/ACT NA SUSP: 2.0000 | Freq: Every day | NASAL | 5 refills | Status: AC

## 2024-09-27 NOTE — Progress Notes (Signed)
 FOLLOW UP Date of Service/Encounter:  09/27/2024   Subjective:  Ashley Sampson (DOB: 1960-12-31) is a 63 y.o. female who [facility] for follow up for skin testing.   History obtained from: chart review and patient.  Anti histamines held.   Past Medical History: Past Medical History:  Diagnosis Date   Allergy     Diabetes mellitus    Hypertension    Thyroid disease     Objective:  There were no vitals taken for this visit. There is no height or weight on file to calculate BMI. Physical Exam: GEN: alert, well developed HEENT: clear conjunctiva, MMM LUNGS: unlabored respiration  Skin Testing:  Skin prick testing was placed, which includes aeroallergens/foods, histamine control, and saline control.  Verbal consent was obtained prior to placing test.  Patient tolerated procedure well.  Allergy  testing results were read and interpreted by myself, documented by clinical staff. Adequate positive and negative control.  Positive results to:  Results discussed with patient/family.  Airborne Adult Perc - 09/27/24 1519     Time Antigen Placed 1519    Allergen Manufacturer Jestine    Location Back    Number of Test 55    2. Control-Histamine 3+    3. Bahia Negative    4. Bermuda Negative    5. Johnson Negative    6. [place]  Blue 3+    7. Meadow Fescue 3+    8. Perennial Rye 3+    9. Timothy 3+    10. Ragweed Mix 2+    11. Cocklebur Negative    12. Plantain,  English Negative    13. Baccharis 3+    14. Dog Fennel 2+    15. Russian Thistle 3+    16. Lamb's Quarters 3+    17. Sheep Sorrell 2+    18. Rough Pigweed 3+    19. Marsh Elder, Rough Negative    20. Mugwort, Common 2+    21. Box, Elder Negative    22. Cedar, red Negative    23. Sweet Gum Negative    24. Pecan Pollen Negative    25. Pine Mix Negative    26. Walnut, Black Pollen Negative    27. Red Mulberry Negative    28. Ash Mix Negative    29. Birch Mix Negative     30. Beech American Negative    31. Cottonwood, Eastern Negative    32. Hickory, White Negative    33. Maple Mix Negative    34. Oak, Eastern Mix Negative    35. Sycamore Eastern Negative    36. Alternaria Alternata Negative    37. Cladosporium Herbarum Negative    38. Aspergillus Mix Negative    39. Penicillium Mix Negative    40. Bipolaris Sorokiniana (Helminthosporium) Negative    41. Drechslera Spicifera (Curvularia) Negative    42. Mucor Plumbeus Negative    43. Fusarium Moniliforme Negative    44. Aureobasidium Pullulans (pullulara) Negative    45. Rhizopus Oryzae Negative    46. Botrytis Cinera Negative    47. Epicoccum Nigrum Negative    48. Phoma Betae Negative    49. Dust Mite Mix 3+    50. Cat Hair 10,000 BAU/ml 3+    51.  Dog Epithelia Negative    52. Mixed Feathers Negative    53. Horse Epithelia Negative    54. Cockroach, German 3+    55. Tobacco Leaf Negative    1. Fire [employer]  2. Other Omitted    3. Other Omitted          Food Adult Perc - 09/27/24 1500     Time Antigen Placed 1519    Allergen Manufacturer Jestine    Location Back    Number of allergen test 6    8. Shellfish Mix Negative    23. Shrimp Negative    24. Crab Negative    25. Lobster Negative    26. Oyster Negative    27. Scallops Negative           Assessment:   1. Seasonal allergic rhinitis due to pollen   2. Allergic urticaria   3. Allergic rhinitis due to dust mite   4. Allergic rhinitis due to animal hair or dander   5. Allergic rhinitis due to insect     Plan/Recommendations:  Allergic Rhinitis: - Due to turbinate hypertrophy, seasonal symptoms and unresponsive to over the counter meds, will perform skin testing to identify aeroallergen triggers.   - Positive skin test 09/2024: grasses, weeds, dust mites, cats, cockroach  - Avoidance measures discussed. - Use nasal saline rinses before nose sprays such as with Neilmed Sinus Rinse.  Use distilled water.   - Use  Flonase  2 sprays each nostril daily. Aim upward and outward. - Use Zyrtec  10 mg daily as needed for runny nose, sneezing, itchy watery eyes.  - Consider allergy  shots as long term control of your symptoms by teaching your immune system to be more tolerant of your allergy  triggers   Food Reactions:  - okay to reintroduce shellfish. - SPT 09/2024: negative to shellfish - Initial rxn: 3 years ago with sensation of throat closing and 3 months ago with hives/swelling/itching but delayed reaction but eating it now without any issues.    ALLERGEN AVOIDANCE MEASURES   Dust Mites Use central air conditioning and heat; and change the filter monthly.  Pleated filters work better than mesh filters.  Electrostatic filters may also be used; wash the filter monthly.  Window air conditioners may be used, but do not clean the air as well as a central air conditioner.  Change or wash the filter monthly. Keep windows closed.  Do not use attic fans.   Encase the mattress, box springs and pillows with zippered, dust proof covers. Wash the bed linens in hot water weekly.   Remove carpet, especially from the bedroom. Remove stuffed animals, throw pillows, dust ruffles, heavy drapes and other items that collect dust from the bedroom. Do not use a humidifier.   Use wood, vinyl or leather furniture instead of cloth furniture in the bedroom. Keep the indoor humidity at 30 - 40%.  Cockroach Limit spread of food around the house; especially keep food out of bedrooms. Keep food and garbage in closed containers with a tight lid.  Never leave food out in the kitchen.  Do not leave out pet food or dirty food bowls. Mop the kitchen floor and wash countertops at least once a week. Repair leaky pipes and faucets so there is no standing water to attract roaches. Plug up cracks in the house through which cockroaches can enter. Use bait stations and approved pesticides to reduce cockroach infestation. Pollen  Avoidance Pollen levels are highest during the mid-day and afternoon.  Consider this when planning outdoor activities. Avoid being outside when the grass is being mowed, or wear a mask if the pollen-allergic person must be the one to mow the grass. Keep the windows closed to keep pollen  outside of the home. Use an air conditioner to filter the air. Take a shower, wash hair, and change clothing after working or playing outdoors during pollen season. Pet Dander Keep the pet out of your bedroom and restrict it to only a few rooms. Be advised that keeping the pet in only one room will not limit the allergens to that room. Dont pet, hug or kiss the pet; if you do, wash your hands with soap and water. High-efficiency particulate air (HEPA) cleaners run continuously in a bedroom or living room can reduce allergen levels over time. Regular use of a high-efficiency vacuum cleaner or a central vacuum can reduce allergen levels. Giving your pet a bath at least once a week can reduce airborne allergen.    Return in about 6 weeks (around 11/08/2024).  Arleta Blanch, MD [facility] 

## 2024-09-27 NOTE — Patient Instructions (Addendum)
 Allergic Rhinitis: - Positive skin test 09/2024: grasses, weeds, dust mites, cats, cockroach  - Use nasal saline rinses before nose sprays such as with Neilmed Sinus Rinse.  Use distilled water.   - Use Flonase  2 sprays each nostril daily. Aim upward and outward. - Use Zyrtec  10 mg daily as needed for runny nose, sneezing, itchy watery eyes.  - Consider allergy  shots as long term control of your symptoms by teaching your immune system to be more tolerant of your allergy  triggers   Food Reactions:  - okay to reintroduce shellfish.   ALLERGEN AVOIDANCE MEASURES   Dust Mites Use central air conditioning and heat; and change the filter monthly.  Pleated filters work better than mesh filters.  Electrostatic filters may also be used; wash the filter monthly.  Window air conditioners may be used, but do not clean the air as well as a central air conditioner.  Change or wash the filter monthly. Keep windows closed.  Do not use attic fans.   Encase the mattress, box springs and pillows with zippered, dust proof covers. Wash the bed linens in hot water weekly.   Remove carpet, especially from the bedroom. Remove stuffed animals, throw pillows, dust ruffles, heavy drapes and other items that collect dust from the bedroom. Do not use a humidifier.   Use wood, vinyl or leather furniture instead of cloth furniture in the bedroom. Keep the indoor humidity at 30 - 40%.  Cockroach Limit spread of food around the house; especially keep food out of bedrooms. Keep food and garbage in closed containers with a tight lid.  Never leave food out in the kitchen.  Do not leave out pet food or dirty food bowls. Mop the kitchen floor and wash countertops at least once a week. Repair leaky pipes and faucets so there is no standing water to attract roaches. Plug up cracks in the house through which cockroaches can enter. Use bait stations and approved pesticides to reduce cockroach infestation. Pollen  Avoidance Pollen levels are highest during the mid-day and afternoon.  Consider this when planning outdoor activities. Avoid being outside when the grass is being mowed, or wear a mask if the pollen-allergic person must be the one to mow the grass. Keep the windows closed to keep pollen outside of the home. Use an air conditioner to filter the air. Take a shower, wash hair, and change clothing after working or playing outdoors during pollen season. Pet Dander Keep the pet out of your bedroom and restrict it to only a few rooms. Be advised that keeping the pet in only one room will not limit the allergens to that room. Dont pet, hug or kiss the pet; if you do, wash your hands with soap and water. High-efficiency particulate air (HEPA) cleaners run continuously in a bedroom or living room can reduce allergen levels over time. Regular use of a high-efficiency vacuum cleaner or a central vacuum can reduce allergen levels. Giving your pet a bath at least once a week can reduce airborne allergen.

## 2024-11-08 ENCOUNTER — Ambulatory Visit: Admitting: Internal Medicine
